# Patient Record
Sex: Male | Born: 1975 | Race: White | Hispanic: No | Marital: Married | Smoking: Never smoker
Health system: Southern US, Community
[De-identification: ages and names within clinical notes are randomized; demographics above are authoritative.]

---

## 1999-12-21 ENCOUNTER — Ambulatory Visit (HOSPITAL_COMMUNITY): Admission: RE | Admit: 1999-12-21 | Discharge: 1999-12-21 | Payer: Self-pay | Admitting: *Deleted

## 1999-12-21 ENCOUNTER — Encounter: Payer: Self-pay | Admitting: *Deleted

## 2010-11-24 ENCOUNTER — Emergency Department (INDEPENDENT_AMBULATORY_CARE_PROVIDER_SITE_OTHER): Payer: BC Managed Care – PPO

## 2010-11-24 ENCOUNTER — Emergency Department (HOSPITAL_BASED_OUTPATIENT_CLINIC_OR_DEPARTMENT_OTHER)
Admission: EM | Admit: 2010-11-24 | Discharge: 2010-11-24 | Disposition: A | Discharge status: Home / Self Care | Payer: BC Managed Care – PPO | Attending: Emergency Medicine | Admitting: Emergency Medicine

## 2010-11-24 DIAGNOSIS — W268XXA Contact with other sharp object(s), not elsewhere classified, initial encounter: Secondary | ICD-10-CM

## 2010-11-24 DIAGNOSIS — S61409A Unspecified open wound of unspecified hand, initial encounter: Secondary | ICD-10-CM

## 2010-11-24 DIAGNOSIS — L02519 Cutaneous abscess of unspecified hand: Secondary | ICD-10-CM | POA: Insufficient documentation

## 2011-10-30 ENCOUNTER — Encounter (INDEPENDENT_AMBULATORY_CARE_PROVIDER_SITE_OTHER): Payer: BC Managed Care – PPO | Admitting: Ophthalmology

## 2011-10-30 DIAGNOSIS — H43819 Vitreous degeneration, unspecified eye: Secondary | ICD-10-CM

## 2011-10-30 DIAGNOSIS — H33009 Unspecified retinal detachment with retinal break, unspecified eye: Secondary | ICD-10-CM

## 2011-10-30 DIAGNOSIS — H33309 Unspecified retinal break, unspecified eye: Secondary | ICD-10-CM

## 2011-11-02 ENCOUNTER — Encounter (INDEPENDENT_AMBULATORY_CARE_PROVIDER_SITE_OTHER): Payer: BC Managed Care – PPO | Admitting: Ophthalmology

## 2011-11-02 DIAGNOSIS — H33309 Unspecified retinal break, unspecified eye: Secondary | ICD-10-CM

## 2011-11-15 ENCOUNTER — Ambulatory Visit (INDEPENDENT_AMBULATORY_CARE_PROVIDER_SITE_OTHER): Payer: BC Managed Care – PPO | Admitting: Ophthalmology

## 2011-11-15 DIAGNOSIS — H33309 Unspecified retinal break, unspecified eye: Secondary | ICD-10-CM

## 2012-03-18 ENCOUNTER — Ambulatory Visit (INDEPENDENT_AMBULATORY_CARE_PROVIDER_SITE_OTHER): Payer: BC Managed Care – PPO | Admitting: Ophthalmology

## 2016-01-11 ENCOUNTER — Observation Stay
Admission: EM | Admit: 2016-01-11 | Discharge: 2016-01-12 | Disposition: A | Discharge status: Home / Self Care | Payer: Managed Care, Other (non HMO) | Attending: Surgery | Admitting: Surgery

## 2016-01-11 ENCOUNTER — Emergency Department: Payer: Managed Care, Other (non HMO)

## 2016-01-11 ENCOUNTER — Encounter: Payer: Self-pay | Admitting: Emergency Medicine

## 2016-01-11 ENCOUNTER — Emergency Department: Payer: Managed Care, Other (non HMO) | Admitting: Anesthesiology

## 2016-01-11 ENCOUNTER — Encounter
Admission: EM | Disposition: A | Discharge status: Home / Self Care | Payer: Self-pay | Source: Home / Self Care | Attending: Emergency Medicine

## 2016-01-11 DIAGNOSIS — K353 Acute appendicitis with localized peritonitis, without perforation or gangrene: Secondary | ICD-10-CM | POA: Insufficient documentation

## 2016-01-11 DIAGNOSIS — K3580 Unspecified acute appendicitis: Secondary | ICD-10-CM | POA: Diagnosis present

## 2016-01-11 DIAGNOSIS — K4090 Unilateral inguinal hernia, without obstruction or gangrene, not specified as recurrent: Secondary | ICD-10-CM | POA: Insufficient documentation

## 2016-01-11 DIAGNOSIS — K8020 Calculus of gallbladder without cholecystitis without obstruction: Secondary | ICD-10-CM | POA: Diagnosis not present

## 2016-01-11 HISTORY — PX: LAPAROSCOPIC APPENDECTOMY: SHX408

## 2016-01-11 LAB — CBC
HEMATOCRIT: 42.1 % (ref 40.0–52.0)
HEMOGLOBIN: 14.4 g/dL (ref 13.0–18.0)
MCH: 29.6 pg (ref 26.0–34.0)
MCHC: 34.2 g/dL (ref 32.0–36.0)
MCV: 86.5 fL (ref 80.0–100.0)
Platelets: 220 10*3/uL (ref 150–440)
RBC: 4.87 MIL/uL (ref 4.40–5.90)
RDW: 13.5 % (ref 11.5–14.5)
WBC: 7.3 10*3/uL (ref 3.8–10.6)

## 2016-01-11 LAB — COMPREHENSIVE METABOLIC PANEL
ALT: 18 U/L (ref 17–63)
AST: 30 U/L (ref 15–41)
Albumin: 4.6 g/dL (ref 3.5–5.0)
Alkaline Phosphatase: 51 U/L (ref 38–126)
Anion gap: 9 (ref 5–15)
BUN: 16 mg/dL (ref 6–20)
CO2: 26 mmol/L (ref 22–32)
Calcium: 9.2 mg/dL (ref 8.9–10.3)
Chloride: 104 mmol/L (ref 101–111)
Creatinine, Ser: 1.28 mg/dL — ABNORMAL HIGH (ref 0.61–1.24)
GFR calc Af Amer: >60 mL/min (ref >60)
GFR calc non Af Amer: >60 mL/min (ref >60)
Glucose, Bld: 106 mg/dL — ABNORMAL HIGH (ref 65–99)
Potassium: 4 mmol/L (ref 3.5–5.1)
Sodium: 139 mmol/L (ref 135–145)
Total Bilirubin: 0.6 mg/dL (ref 0.3–1.2)
Total Protein: 7 g/dL (ref 6.5–8.1)

## 2016-01-11 LAB — URINALYSIS COMPLETE WITH MICROSCOPIC (ARMC ONLY)
BACTERIA UA: NONE SEEN
Bilirubin Urine: NEGATIVE
GLUCOSE, UA: NEGATIVE mg/dL
Ketones, ur: NEGATIVE mg/dL
Leukocytes, UA: NEGATIVE
Nitrite: NEGATIVE
Protein, ur: NEGATIVE mg/dL
SQUAMOUS EPITHELIAL / LPF: NONE SEEN
Specific Gravity, Urine: 1.024 (ref 1.005–1.030)
pH: 5 (ref 5.0–8.0)

## 2016-01-11 LAB — LIPASE, BLOOD: Lipase: 23 U/L (ref 11–51)

## 2016-01-11 SURGERY — APPENDECTOMY, LAPAROSCOPIC
Anesthesia: General

## 2016-01-11 MED ORDER — LIDOCAINE HCL (CARDIAC) 20 MG/ML IV SOLN
INTRAVENOUS | Status: DC | PRN
  Administered 2016-01-11: 20 mg via INTRAVENOUS

## 2016-01-11 MED ORDER — MORPHINE SULFATE (PF) 2 MG/ML IV SOLN: 2.0000 mg | INTRAVENOUS | Status: DC | PRN

## 2016-01-11 MED ORDER — ONDANSETRON HCL 4 MG/2ML IJ SOLN
INTRAMUSCULAR | Status: DC | PRN
  Administered 2016-01-11: 4 mg via INTRAVENOUS

## 2016-01-11 MED ORDER — HYDROCODONE-ACETAMINOPHEN 5-325 MG PO TABS
1.0000 | ORAL_TABLET | ORAL | Status: DC | PRN
  Administered 2016-01-12 (×2): 1 via ORAL
  Filled 2016-01-11 (×2): qty 1

## 2016-01-11 MED ORDER — DIATRIZOATE MEGLUMINE & SODIUM 66-10 % PO SOLN
15.0000 mL | Freq: Once | ORAL | Status: AC
  Administered 2016-01-11: 15 mL via ORAL

## 2016-01-11 MED ORDER — DEXAMETHASONE SODIUM PHOSPHATE 10 MG/ML IJ SOLN
INTRAMUSCULAR | Status: DC | PRN
  Administered 2016-01-11: 4 mg via INTRAVENOUS

## 2016-01-11 MED ORDER — BUPIVACAINE-EPINEPHRINE (PF) 0.25% -1:200000 IJ SOLN
INTRAMUSCULAR | Status: DC | PRN
  Administered 2016-01-11: 30 mL via PERINEURAL

## 2016-01-11 MED ORDER — ONDANSETRON HCL 4 MG/2ML IJ SOLN: 4.0000 mg | Freq: Once | INTRAMUSCULAR | Status: DC | PRN

## 2016-01-11 MED ORDER — IOPAMIDOL (ISOVUE-300) INJECTION 61%
100.0000 mL | Freq: Once | INTRAVENOUS | Status: AC | PRN
  Administered 2016-01-11: 100 mL via INTRAVENOUS
  Filled 2016-01-11: qty 100

## 2016-01-11 MED ORDER — FENTANYL CITRATE (PF) 100 MCG/2ML IJ SOLN: 25.0000 ug | INTRAMUSCULAR | Status: DC | PRN

## 2016-01-11 MED ORDER — SUGAMMADEX SODIUM 500 MG/5ML IV SOLN
INTRAVENOUS | Status: DC | PRN
  Administered 2016-01-11: 390 mg via INTRAVENOUS

## 2016-01-11 MED ORDER — DEXTROSE 5 % IV SOLN
2.0000 g | Freq: Once | INTRAVENOUS | Status: AC
  Administered 2016-01-11: 2 g via INTRAVENOUS
  Filled 2016-01-11: qty 2

## 2016-01-11 MED ORDER — SUCCINYLCHOLINE CHLORIDE 20 MG/ML IJ SOLN
INTRAMUSCULAR | Status: DC | PRN
  Administered 2016-01-11: 120 mg via INTRAVENOUS

## 2016-01-11 MED ORDER — ROCURONIUM BROMIDE 100 MG/10ML IV SOLN
INTRAVENOUS | Status: DC | PRN
  Administered 2016-01-11: 20 mg via INTRAVENOUS

## 2016-01-11 MED ORDER — FENTANYL CITRATE (PF) 100 MCG/2ML IJ SOLN
INTRAMUSCULAR | Status: DC | PRN
  Administered 2016-01-11: 100 ug via INTRAVENOUS
  Administered 2016-01-11: 50 ug via INTRAVENOUS

## 2016-01-11 MED ORDER — LACTATED RINGERS IV SOLN
INTRAVENOUS | Status: DC
Start: 2016-01-11 — End: 2016-01-12
  Administered 2016-01-12: 01:00:00 via INTRAVENOUS

## 2016-01-11 MED ORDER — PROPOFOL 10 MG/ML IV BOLUS
INTRAVENOUS | Status: DC | PRN
  Administered 2016-01-11: 170 mg via INTRAVENOUS

## 2016-01-11 MED ORDER — MIDAZOLAM HCL 2 MG/2ML IJ SOLN
INTRAMUSCULAR | Status: DC | PRN
  Administered 2016-01-11: 2 mg via INTRAVENOUS

## 2016-01-11 MED ORDER — LACTATED RINGERS IV SOLN
INTRAVENOUS | Status: DC | PRN
Start: 2016-01-11 — End: 2016-01-11
  Administered 2016-01-11: 22:00:00 via INTRAVENOUS

## 2016-01-11 MED ORDER — HYDROCODONE-ACETAMINOPHEN 5-325 MG PO TABS: 1.0000 | ORAL_TABLET | ORAL | Status: AC | PRN

## 2016-01-11 MED ORDER — LACTATED RINGERS IV SOLN: INTRAVENOUS | Status: DC

## 2016-01-11 SURGICAL SUPPLY — 49 items
ADH LQ OCL WTPRF AMP STRL LF (MISCELLANEOUS) ×1
ADHESIVE MASTISOL STRL (MISCELLANEOUS) ×3 IMPLANT
APPLIER CLIP ROT 10 11.4 M/L (STAPLE) ×3
APR CLP MED LRG 11.4X10 (STAPLE) ×1
BLADE SURG SZ11 CARB STEEL (BLADE) ×3 IMPLANT
CANISTER SUCT 3000ML (MISCELLANEOUS) ×3 IMPLANT
CATH TRAY 16F METER LATEX (MISCELLANEOUS) ×3 IMPLANT
CHLORAPREP W/TINT 26ML (MISCELLANEOUS) ×3 IMPLANT
CLIP APPLIE ROT 10 11.4 M/L (STAPLE) ×1 IMPLANT
CLOSURE WOUND 1/2 X4 (GAUZE/BANDAGES/DRESSINGS) ×1
CUTTER FLEX LINEAR 45M (STAPLE) ×3 IMPLANT
DEVICE TROCAR PUNCTURE CLOSURE (ENDOMECHANICALS) ×3 IMPLANT
ELECT REM PT RETURN 9FT ADLT (ELECTROSURGICAL) ×3
ELECTRODE REM PT RTRN 9FT ADLT (ELECTROSURGICAL) ×1 IMPLANT
ENDOPOUCH RETRIEVER 10 (MISCELLANEOUS) ×3 IMPLANT
GAUZE SPONGE NON-WVN 2X2 STRL (MISCELLANEOUS) ×3 IMPLANT
GLOVE BIO SURGEON STRL SZ8 (GLOVE) ×9 IMPLANT
GOWN STRL REUS W/ TWL LRG LVL3 (GOWN DISPOSABLE) ×2 IMPLANT
GOWN STRL REUS W/TWL LRG LVL3 (GOWN DISPOSABLE) ×6
HEMOSTAT SURGICEL 2X14 (HEMOSTASIS) ×2 IMPLANT
IRRIGATION STRYKERFLOW (MISCELLANEOUS) ×1 IMPLANT
IRRIGATOR STRYKERFLOW (MISCELLANEOUS) ×3
KIT RM TURNOVER STRD PROC AR (KITS) ×3 IMPLANT
LABEL OR SOLS (LABEL) ×1 IMPLANT
NDL SAFETY 22GX1.5 (NEEDLE) ×3 IMPLANT
NEEDLE VERESS 14GA 120MM (NEEDLE) ×3 IMPLANT
NS IRRIG 500ML POUR BTL (IV SOLUTION) ×3 IMPLANT
PACK LAP CHOLECYSTECTOMY (MISCELLANEOUS) ×3 IMPLANT
RELOAD 45 VASCULAR/THIN (ENDOMECHANICALS) ×3 IMPLANT
RELOAD STAPLE 45 2.5 WHT GRN (ENDOMECHANICALS) ×1 IMPLANT
RELOAD STAPLE 45 3.5 BLU ETS (ENDOMECHANICALS) ×1 IMPLANT
RELOAD STAPLE TA45 3.5 REG BLU (ENDOMECHANICALS) ×3 IMPLANT
SCISSORS METZENBAUM CVD 33 (INSTRUMENTS) ×2 IMPLANT
SLEEVE ENDOPATH XCEL 5M (ENDOMECHANICALS) ×3 IMPLANT
SOL .9 NS 3000ML IRR  AL (IV SOLUTION) ×2
SOL .9 NS 3000ML IRR AL (IV SOLUTION) ×1
SOL .9 NS 3000ML IRR UROMATIC (IV SOLUTION) ×1 IMPLANT
SPONGE LAP 18X18 5 PK (GAUZE/BANDAGES/DRESSINGS) ×3 IMPLANT
SPONGE VERSALON 2X2 STRL (MISCELLANEOUS) ×9
STRIP CLOSURE SKIN 1/2X4 (GAUZE/BANDAGES/DRESSINGS) ×2 IMPLANT
SUT MNCRL 4-0 (SUTURE) ×3
SUT MNCRL 4-0 27XMFL (SUTURE) ×1
SUT VICRYL 0 TIES 12 18 (SUTURE) ×3 IMPLANT
SUTURE MNCRL 4-0 27XMF (SUTURE) ×1 IMPLANT
TRAP SPECIMEN MUCOUS 40CC (MISCELLANEOUS) ×1 IMPLANT
TROCAR XCEL 12X100 BLDLESS (ENDOMECHANICALS) ×3 IMPLANT
TROCAR XCEL NON-BLD 5MMX100MML (ENDOMECHANICALS) ×3 IMPLANT
TUBING INSUFFLATOR HI FLOW (MISCELLANEOUS) ×3 IMPLANT
TUBING SCD EXTENSION 9995 GYN (TUBING) ×2 IMPLANT

## 2016-01-11 NOTE — Anesthesia Procedure Notes (Signed)
Procedure Name: Intubation Date/Time: 01/11/2016 10:17 PM Performed by: Waldo LaineJUSTIS, Hermes Wafer Pre-anesthesia Checklist: Emergency Drugs available, Suction available, Timeout performed and Patient identified Patient Re-evaluated:Patient Re-evaluated prior to inductionOxygen Delivery Method: Circle system utilized Preoxygenation: Pre-oxygenation with 100% oxygen Intubation Type: IV induction, Rapid sequence and Cricoid Pressure applied Laryngoscope Size: Miller and 2 Grade View: Grade I Number of attempts: 1 Airway Equipment and Method: Stylet Placement Confirmation: ETT inserted through vocal cords under direct vision,  positive ETCO2 and breath sounds checked- equal and bilateral Secured at: 22 cm Tube secured with: Tape Dental Injury: Teeth and Oropharynx as per pre-operative assessment

## 2016-01-11 NOTE — Op Note (Signed)
laparascopic appendectomy   Donald Farrell Date of operation:  01/11/2016  Indications: The patient presented with a history of  abdominal pain. Workup has revealed findings consistent with acute appendicitis.  Pre-operative Diagnosis: Acute appendicitis  Post-operative Diagnosis: Acute suppurative appendicitis nonruptured  Surgeon: Adah Salvageichard E. Excell Seltzerooper, MD, FACS  Anesthesia: General with endotracheal tube  Procedure Details  The patient was seen again in the preop area. The options of surgery versus observation were reviewed with the patient and/or family. The risks of bleeding, infection, recurrence of symptoms, negative laparoscopy, potential for an open procedure, bowel injury, abscess or infection, were all reviewed as well. The patient was taken to Operating Room, identified as Donald Farrell and the procedure verified as laparoscopic appendectomy. A Time Out was held and the above information confirmed.  The patient was placed in the supine position and general anesthesia was induced.  Antibiotic prophylaxis was administered and VT E prophylaxis was in place. A Foley catheter was placed by the nursing staff.   The abdomen was prepped and draped in a sterile fashion. An infraumbilical incision was made. A Veress needle was placed and pneumoperitoneum was obtained. A 5 mm trocar port was placed without difficulty and the abdominal cavity was explored.  Under direct vision a 5 mm suprapubic port was placed and a 13 mm left lateral port was placed all under direct vision.  The appendix was identified and found to be acutely inflamed . It was in a partially retrocecal position and the lateral reflection was taken down with sharp dissection only .  The appendix was carefully dissected. The base of the appendix was dissected out and divided with a standard load Endo GIA. The mesoappendix was divided with a vascular load Endo GIA. There was some bleeding from the staple line and this was  reinforced with clips. The appendix was passed out through the left lateral port site with the aid of an Endo Catch bag. The right lower quadrant and pelvis was then irrigated with copious amounts of normal saline which was aspirated. There was still some bleeding from the raw edges of the retroperitoneal surface. This was irrigated and aspirated multiple times and still continued to bleed but no obvious site of surgical bleeding could be identified. A piece of Surgicel was placed in this area and reinspected. Inspection  failed to identify any additional bleeding and there were no signs of bowel injury. Therefore the left lateral port site was closed under direct vision utilizing an Endo Close technique with 0 Vicryl interrupted sutures, all under direct vision.   Again the right lower quadrant was inspected there was no sign of bleeding or bowel injury therefore pneumoperitoneum was released, all ports were removed and the skin incisions were approximated with subcuticular 4-0 Monocryl. Steri-Strips and Mastisol and sterile dressings were placed.  The patient tolerated the procedure well, there were no complications. The sponge lap and needle count were correct at the end of the procedure.  The patient was taken to the recovery room in stable condition to be admitted for continued care.  Findings: Acute appendicitis nonruptured  Estimated Blood Loss: 25 cc                  Specimens: appendix         Complications:  None                  Brionne Mertz E. Excell Seltzerooper MD, FACS

## 2016-01-11 NOTE — ED Provider Notes (Signed)
[facility] Emergency Department Provider Note  ____________________________________________    I have reviewed the triage vital signs and the nursing notes.   HISTORY  Chief Complaint Abdominal Pain    HPI TRIPTON NED is a 40 y.o. male who presents with complaints of abdominal pain. Patient reports generalized abdominal discomfort started yesterday with some mild nausea and decreased appetite. Today his pain localized to the right lower quadrant. He has never had this before. He denies injury to the area. No fevers or chills. Normal stools. No history of abdominal surgery   History reviewed. No pertinent past medical history.  There are no active problems to display for this patient.   History reviewed. No pertinent past surgical history.  No current outpatient prescriptions on file.  Allergies Review of patient's allergies indicates no known allergies.  History reviewed. No pertinent family history.  Social History Social History  Substance Use Topics  . Smoking status: Never Smoker   . Smokeless tobacco: None  . Alcohol Use: Yes    Review of Systems  Constitutional: Negative for fever. Eyes: Negative for redness ENT: Negative for sore throat Cardiovascular: Negative for chest pain Respiratory: Negative for Cough Gastrointestinal: As above Genitourinary: Negative for dysuria. No hematuria Musculoskeletal: Negative for back pain. Skin: Negative for rash. Neurological: Negative for headache Psychiatric: no anxiety    ____________________________________________   PHYSICAL EXAM:  VITAL SIGNS: ED Triage Vitals  Enc Vitals Group     BP 01/11/16 1756 155/90 mmHg     Pulse Rate 01/11/16 1756 69     Resp 01/11/16 1756 20     Temp 01/11/16 1756 97.6 F (36.4 C)     Temp Source 01/11/16 1756 Oral     SpO2 01/11/16 1756 100 %     Weight 01/11/16 1756 215 lb (97.523 kg)     Height 01/11/16 1756 6' (1.829 m)     Head Cir --       Peak Flow --      Pain Score 01/11/16 1757 2     Pain Loc --      Pain Edu? --      Excl. in GC? --      Constitutional: Alert and oriented. Well appearing and in no distress.  Eyes: Conjunctivae are normal. No erythema or injection ENT   Head: Normocephalic and atraumatic.   Mouth/Throat: Mucous membranes are moist. Cardiovascular: Normal rate, regular rhythm. Normal and symmetric distal pulses are present in the upper extremities.  Respiratory: Normal respiratory effort without tachypnea nor retractions.  Gastrointestinal: Tenderness palpation over McBurney's point. No distention. There is no CVA tenderness. Genitourinary: deferred Musculoskeletal: Nontender with normal range of motion in all extremities.  Neurologic:  Normal speech and language. No gross focal neurologic deficits are appreciated. Skin:  Skin is warm, dry and intact. No rash noted. Psychiatric: Mood and affect are normal. Patient exhibits appropriate insight and judgment.  ____________________________________________    LABS (pertinent positives/negatives)  Labs Reviewed  COMPREHENSIVE METABOLIC PANEL - Abnormal; Notable for the following:    Glucose, Bld 106 (*)    Creatinine, Ser 1.28 (*)    All other components within normal limits  URINALYSIS COMPLETEWITH MICROSCOPIC ([facility] ONLY) - Abnormal; Notable for the following:    Color, Urine YELLOW (*)    APPearance CLEAR (*)    Hgb urine dipstick 1+ (*)    All other components within normal limits  LIPASE, BLOOD  CBC    ____________________________________________   EKG  None  ____________________________________________    RADIOLOGY  CT consistent with acute appendicitis  ____________________________________________   PROCEDURES  Procedure(s) performed: none  Critical Care performed: none  ____________________________________________   INITIAL IMPRESSION / ASSESSMENT AND PLAN / ED COURSE  Pertinent labs & imaging results  that were available during my care of the patient were reviewed by me and considered in my medical decision making (see chart for details).  Patient presents with generalized abdominal pain now localized to the right lower quadrant, this is suspicious for appendicitis. We will obtain CT abdomen and pelvis and reevaluate  CT scan is consistent with acute appendicitis, discussed with Dr. Excell Seltzerooper of surgery who will see the patient  ____________________________________________   FINAL CLINICAL IMPRESSION(S) / ED DIAGNOSES  Final diagnoses:  Acute appendicitis with localized peritonitis          Donald Everyobert Trentin Knappenberger, MD 01/11/16 2051

## 2016-01-11 NOTE — Transfer of Care (Signed)
Immediate Anesthesia Transfer of Care Note  Patient: Donald Farrell  Procedure(s) Performed: Procedure(s): APPENDECTOMY LAPAROSCOPIC (N/A)  Patient Location: PACU  Anesthesia Type:General  Level of Consciousness: awake  Airway & Oxygen Therapy: Patient Spontanous Breathing and Patient connected to face mask oxygen  Post-op Assessment: Report given to RN and Post -op Vital signs reviewed and stable  Post vital signs: Reviewed and stable  Last Vitals:  Filed Vitals:   01/11/16 1756 01/11/16 2107  BP: 155/90 137/102  Pulse: 69 64  Temp: 36.4 C 36.9 C  Resp: 20 16    Last Pain:  Filed Vitals:   01/11/16 2112  PainSc: 0-No pain         Complications: No apparent anesthesia complications

## 2016-01-11 NOTE — H&P (Signed)
Donald Farrell is an 40 y.o. male.    Chief Complaint: Right lower quadrant pain  HPI: This a patient with 2 days of right lower quadrant pain it actually started in the epigastrium or periumbilical area and migrated to the right side it is slightly worsened but gradually. He's had some nausea and no emesis no fevers or chills never had an episode like this before workup in the emergency room suggest acute appendicitis on CT scan. Patient had 2 ablations for irregular heart beat back in 2010 and 2011 no symptoms since and no medications History reviewed. No pertinent past medical history.  History reviewed. No pertinent past surgical history.  History reviewed. No pertinent family history. Social History:  reports that he has never smoked. He does not have any smokeless tobacco history on file. He reports that he drinks alcohol. He reports that he does not use illicit drugs.  Allergies: No Known Allergies   (Not in a hospital admission)   Review of Systems  Constitutional: Negative for fever and chills.  HENT: Negative.   Eyes: Negative.   Respiratory: Negative.   Cardiovascular: Negative.   Gastrointestinal: Positive for nausea and abdominal pain. Negative for heartburn, vomiting, diarrhea and constipation.  Genitourinary: Negative for dysuria, urgency and frequency.  Musculoskeletal: Negative.   Skin: Negative.   Neurological: Negative.   Endo/Heme/Allergies: Negative.   Psychiatric/Behavioral: Negative.      Physical Exam:  BP 137/102 mmHg  Pulse 64  Temp(Src) 98.4 F (36.9 C) (Oral)  Resp 16  Ht 6' (1.829 m)  Wt 215 lb (97.523 kg)  BMI 29.15 kg/m2  SpO2 100%  Physical Exam  Constitutional: He is oriented to person, place, and time and well-developed, well-nourished, and in no distress. No distress.  HENT:  Head: Normocephalic and atraumatic.  Eyes: Pupils are equal, round, and reactive to light. Right eye exhibits no discharge. Left eye exhibits no  discharge. No scleral icterus.  Neck: Normal range of motion.  Cardiovascular: Normal rate, regular rhythm and normal heart sounds.   Pulmonary/Chest: Effort normal and breath sounds normal. No respiratory distress. He has no wheezes. He has no rales.  Abdominal: Soft. He exhibits no distension. There is tenderness. There is no rebound and no guarding.  Mild percussion tenderness maximal tenderness at McBurney's point CVA tenderness  Musculoskeletal: Normal range of motion. He exhibits no edema.  Lymphadenopathy:    He has no cervical adenopathy.  Neurological: He is alert and oriented to person, place, and time.  Skin: Skin is warm and dry. He is not diaphoretic. No erythema.  Psychiatric: Mood and affect normal.  Vitals reviewed.       Results for orders placed or performed during the hospital encounter of 01/11/16 (from the past 48 hour(s))  Lipase, blood     Status: None   Collection Time: 01/11/16  6:00 PM  Result Value Ref Range   Lipase 23 11 - 51 U/L  Comprehensive metabolic panel     Status: Abnormal   Collection Time: 01/11/16  6:00 PM  Result Value Ref Range   Sodium 139 135 - 145 mmol/L   Potassium 4.0 3.5 - 5.1 mmol/L   Chloride 104 101 - 111 mmol/L   CO2 26 22 - 32 mmol/L   Glucose, Bld 106 (H) 65 - 99 mg/dL   BUN 16 6 - 20 mg/dL   Creatinine, Ser 1.28 (H) 0.61 - 1.24 mg/dL   Calcium 9.2 8.9 - 10.3 mg/dL   Total Protein 7.0 6.5 -  8.1 g/dL   Albumin 4.6 3.5 - 5.0 g/dL   AST 30 15 - 41 U/L   ALT 18 17 - 63 U/L   Alkaline Phosphatase 51 38 - 126 U/L   Total Bilirubin 0.6 0.3 - 1.2 mg/dL   GFR calc non Af Amer >60 >60 mL/min   GFR calc Af Amer >60 >60 mL/min    Comment: (NOTE) The eGFR has been calculated using the CKD EPI equation. This calculation has not been validated in all clinical situations. eGFR's persistently <60 mL/min signify possible Chronic Kidney Disease.    Anion gap 9 5 - 15  CBC     Status: None   Collection Time: 01/11/16  6:00 PM   Result Value Ref Range   WBC 7.3 3.8 - 10.6 K/uL   RBC 4.87 4.40 - 5.90 MIL/uL   Hemoglobin 14.4 13.0 - 18.0 g/dL   HCT 42.1 40.0 - 52.0 %   MCV 86.5 80.0 - 100.0 fL   MCH 29.6 26.0 - 34.0 pg   MCHC 34.2 32.0 - 36.0 g/dL   RDW 13.5 11.5 - 14.5 %   Platelets 220 150 - 440 K/uL  Urinalysis complete, with microscopic     Status: Abnormal   Collection Time: 01/11/16  6:00 PM  Result Value Ref Range   Color, Urine YELLOW (A) YELLOW   APPearance CLEAR (A) CLEAR   Glucose, UA NEGATIVE NEGATIVE mg/dL   Bilirubin Urine NEGATIVE NEGATIVE   Ketones, ur NEGATIVE NEGATIVE mg/dL   Specific Gravity, Urine 1.024 1.005 - 1.030   Hgb urine dipstick 1+ (A) NEGATIVE   pH 5.0 5.0 - 8.0   Protein, ur NEGATIVE NEGATIVE mg/dL   Nitrite NEGATIVE NEGATIVE   Leukocytes, UA NEGATIVE NEGATIVE   RBC / HPF 0-5 0 - 5 RBC/hpf   WBC, UA 0-5 0 - 5 WBC/hpf   Bacteria, UA NONE SEEN NONE SEEN   Squamous Epithelial / LPF NONE SEEN NONE SEEN   Ct Abdomen Pelvis W Contrast  01/11/2016  CLINICAL DATA:  40 year old male with right lower quadrant abdominal pain. EXAM: CT ABDOMEN AND PELVIS WITH CONTRAST TECHNIQUE: Multidetector CT imaging of the abdomen and pelvis was performed using the standard protocol following bolus administration of intravenous contrast. CONTRAST:  177m ISOVUE-300 IOPAMIDOL (ISOVUE-300) INJECTION 61% COMPARISON:  None. FINDINGS: The visualized lung bases are clear. No intra-abdominal free air or free fluid. There multiple stones within the gallbladder. No pericholecystic fluid or evidence of active inflammation. There is apparent minimal fatty infiltration of the liver. A subcentimeter hypodense focus in the right lobe of the liver inferiorly adjacent to the gallbladder fossa is (series 2, image 26) is not characterized but may represent a hemangioma. The pancreas, spleen, adrenal glands, kidneys, visualized ureters, and urinary bladder appear unremarkable. 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 The prostate and seminal vesicles are grossly unremarkable There is no evidence of bowel obstruction. Go the appendix is enlarged and inflamed. The appendix is located in the right lower quadrant posterior and inferior to the cecum. No drainable fluid collection/ abscess identified. There is no evidence of appendiceal rupture or perforation. The abdominal aorta and IVC appears unremarkable. No portal venous gas identified. There is no adenopathy. Small fat containing left inguinal hernia. The abdominal wall soft tissues are otherwise unremarkable. The osseous structures are intact. IMPRESSION: Acute appendicitis.  No abscess. Cholelithiasis. Electronically Signed   By: AAnner CreteM.D.   On: 01/11/2016 20:25  Assessment/Plan  Likely acute appendicitis by history physical exam and CT findings his white blood cell count is normal. I recommend a laparoscopy I did discuss with him antibiotic therapy and the risk of failure. We discussed risks of bleeding infection recurrence negative laparoscopy inaccuracies of CAT scans. He has family understood and agreed to proceed with the plan for laparoscopic appendectomy tonight  Florene Glen, MD, FACS

## 2016-01-11 NOTE — ED Notes (Signed)
Pt to ed with c/o abd pain that started about 2 days ago with nausea.  Pt denies vomiting and denies diarrhea.

## 2016-01-11 NOTE — Anesthesia Preprocedure Evaluation (Addendum)
Anesthesia Evaluation  Patient identified by MRN, date of birth, ID band Patient awake    Reviewed: Allergy & Precautions, NPO status , Patient's Chart, lab work & pertinent test results, reviewed documented beta blocker date and time   Airway Mallampati: II  TM Distance: >3 FB     Dental  (+) Chipped   Pulmonary           Cardiovascular + dysrhythmias Ventricular Tachycardia      Neuro/Psych    GI/Hepatic   Endo/Other    Renal/GU      Musculoskeletal   Abdominal   Peds  Hematology   Anesthesia Other Findings Hx of severe tachycardia and multiple PVCs. Has had 2 cardiac ablations, last one in 2012. No tachy since, only occasional PVCs.  Reproductive/Obstetrics                            Anesthesia Physical Anesthesia Plan  ASA: II  Anesthesia Plan: General   Post-op Pain Management:    Induction: Intravenous and Rapid sequence  Airway Management Planned: Oral ETT  Additional Equipment:   Intra-op Plan:   Post-operative Plan:   Informed Consent: I have reviewed the patients History and Physical, chart, labs and discussed the procedure including the risks, benefits and alternatives for the proposed anesthesia with the patient or authorized representative who has indicated his/her understanding and acceptance.     Plan Discussed with: CRNA  Anesthesia Plan Comments:         Anesthesia Quick Evaluation

## 2016-01-11 NOTE — Discharge Instructions (Signed)
Remove dressing in 24 hours. °May shower in 24 hours. °Leave paper strips in place. °Resume all home medications. °Follow-up with Dr. Jatziry Wechter in 10 days. °

## 2016-01-12 ENCOUNTER — Encounter: Payer: Self-pay | Admitting: Surgery

## 2016-01-12 NOTE — Discharge Summary (Signed)
  Patient ID: Donald Farrell MRN: [id number] DOB/AGE: [birth year] 40 y.o.  Admit date: 01/11/2016 Discharge date: 01/12/2016   Discharge Diagnoses:  Active Problems:   Acute appendicitis   Procedures:lap appy by Dr. Fausto Skillernooper  Hospital Course: 40 yo admitted with RLQ pain and w/u revealed appendicitis. Had an uneventful appendectomy and was kept overnight. At the time of DC he was ambulating , tolerating PO, AVSS. Abdomen was soft , incisions c/d/i w/o infections. Condition at time of DC is stable   Disposition: 01-Home or Self Care  Discharge Instructions    Call MD for:  difficulty breathing, headache or visual disturbances    Complete by:  As directed      Call MD for:  hives    Complete by:  As directed      Call MD for:  persistant dizziness or light-headedness    Complete by:  As directed      Call MD for:  persistant nausea and vomiting    Complete by:  As directed      Call MD for:  redness, tenderness, or signs of infection (pain, swelling, redness, odor or green/yellow discharge around incision site)    Complete by:  As directed      Call MD for:  severe uncontrolled pain    Complete by:  As directed      Call MD for:  temperature >100.4    Complete by:  As directed      Diet - low sodium heart healthy    Complete by:  As directed      Increase activity slowly    Complete by:  As directed      Lifting restrictions    Complete by:  As directed   20 lbs     Remove dressing in 24 hours    Complete by:  As directed             Medication List    TAKE these medications        HYDROcodone-acetaminophen 5-325 MG tablet  Commonly known as:  NORCO/VICODIN  Take 1-2 tablets by mouth every 4 (four) hours as needed for moderate pain.           Follow-up Information    Follow up with Dionne Miloichard Cooper, MD In 10 days.   Specialty:  Surgery   Why:  For wound re-check   Contact information:   [address] [place] [id number] [phone]         Sterling Bigiego Lossie Kalp, MD FACS

## 2016-01-12 NOTE — Anesthesia Postprocedure Evaluation (Signed)
Anesthesia Post Note  Patient: Donald Farrell  Procedure(s) Performed: Procedure(s) (LRB): APPENDECTOMY LAPAROSCOPIC (N/A)  Patient location during evaluation: Other Anesthesia Type: General Level of consciousness: awake and alert Pain management: pain level controlled Vital Signs Assessment: post-procedure vital signs reviewed and stable Respiratory status: spontaneous breathing, nonlabored ventilation, respiratory function stable and patient connected to nasal cannula oxygen Cardiovascular status: blood pressure returned to baseline and stable Postop Assessment: no signs of nausea or vomiting Anesthetic complications: no    Last Vitals:  Filed Vitals:   01/12/16 0126 01/12/16 0511  BP: 129/84 124/70  Pulse: 63 64  Temp: 36.7 C 36.6 C  Resp: 18 18    Last Pain:  Filed Vitals:   01/12/16 0527  PainSc: 3                  Daquawn Seelman S

## 2016-01-12 NOTE — Progress Notes (Signed)
Pt d/c to home today. IV removed intact.  Rx's given to pt w/all questions and concerns addressed.  D/C paperwork reviewed and education provided with all questions and concerns addressed.  Pt wife at bedside for home transport.  Volunteer services contact for transportation from room to exit.    

## 2016-01-13 LAB — SURGICAL PATHOLOGY

## 2016-01-20 ENCOUNTER — Ambulatory Visit: Payer: Managed Care, Other (non HMO) | Admitting: Surgery

## 2017-02-18 IMAGING — CT CT ABD-PELV W/ CM
2 of 4 series · 15 of 46 positions shown, 17 images · IV contrast (iopamidol)
Comparison: None.

CLINICAL DATA: 40-year-old male with right lower quadrant abdominal
pain.

EXAM:
CT ABDOMEN AND PELVIS WITH CONTRAST
TECHNIQUE: Multidetector CT imaging of the abdomen and pelvis was performed
using the standard protocol following bolus administration of
intravenous contrast.
CONTRAST:  100mL [id number]-PUU IOPAMIDOL (2D94KB-PUU) INJECTION 61%

[Series 2: routine abd pel with · axial · 0.82mm/px · z∈[+132,+627]mm · 12 of 109 slices shown, 14 images]
[im 5/109  soft-tissue]
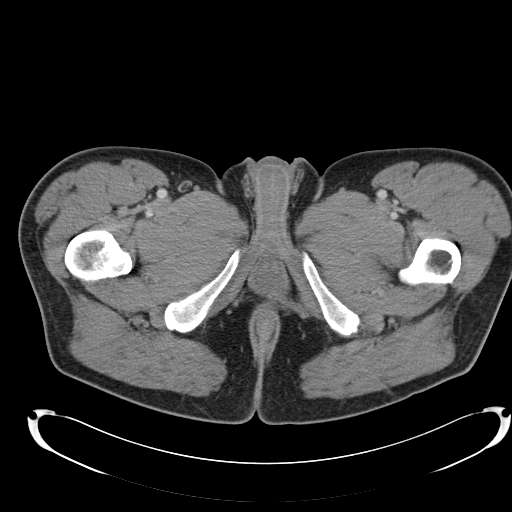
[im 5/109  bone]
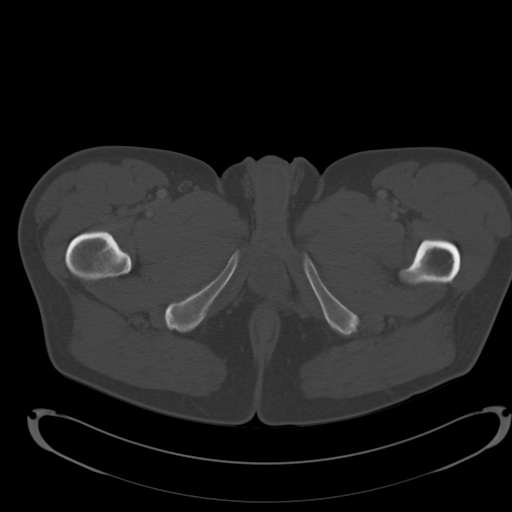
[im 14/109  soft-tissue]
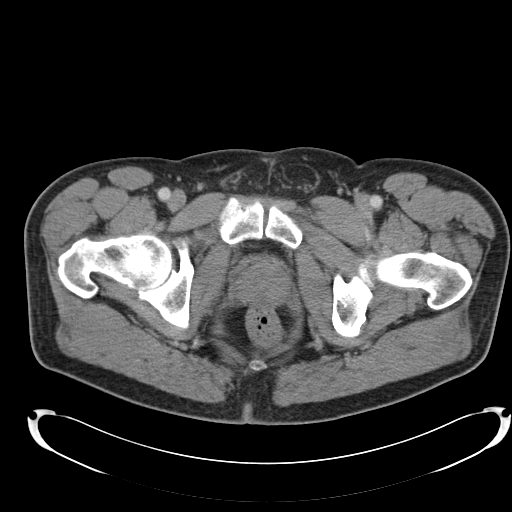
[im 23/109  soft-tissue]
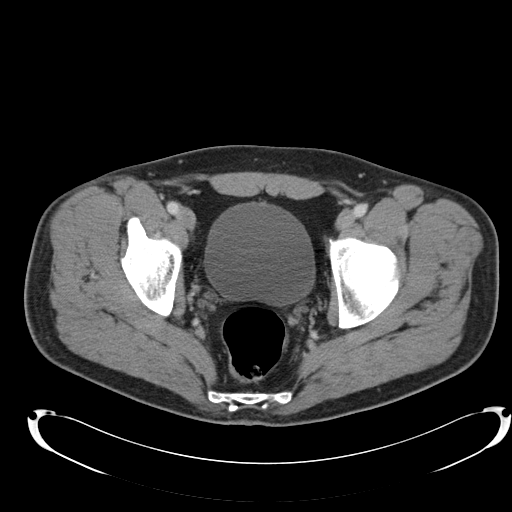
[im 32/109  soft-tissue]
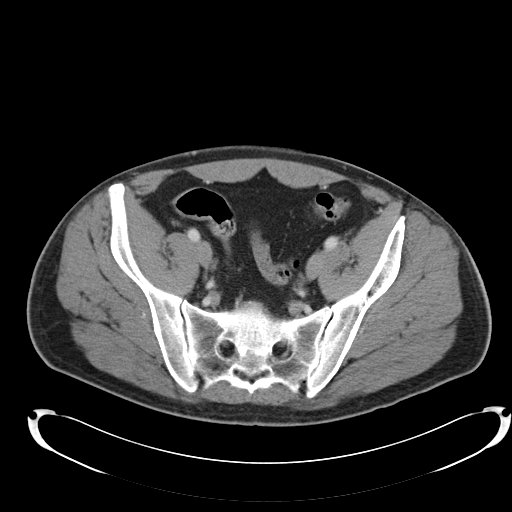
[im 41/109  soft-tissue]
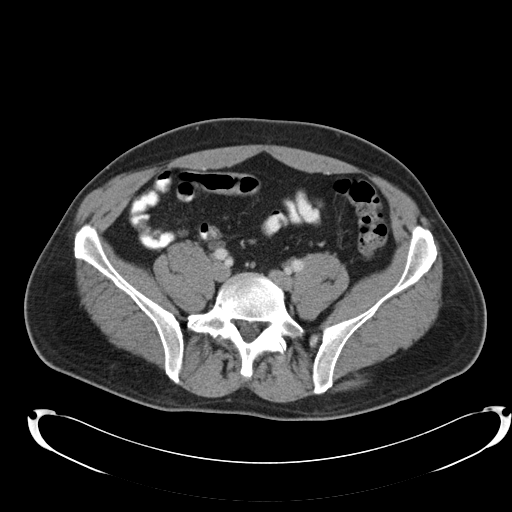
[im 50/109  soft-tissue]
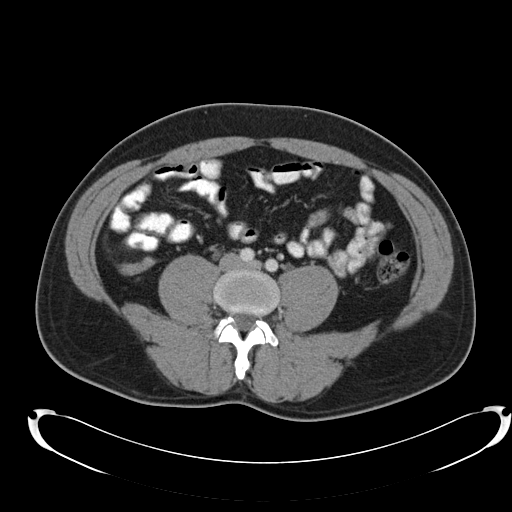
[im 59/109  soft-tissue]
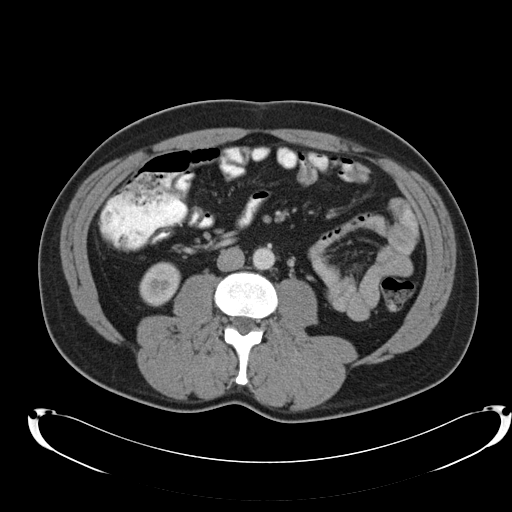
[im 68/109  soft-tissue]
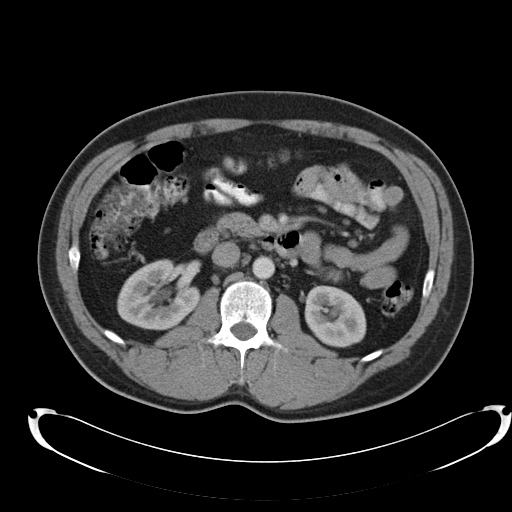
[im 77/109  soft-tissue]
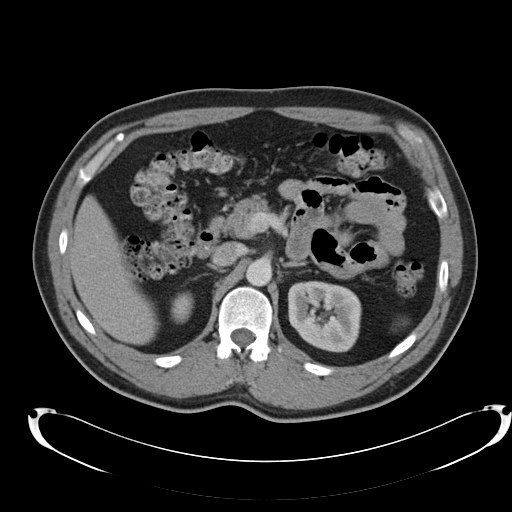
[im 77/109  bone]
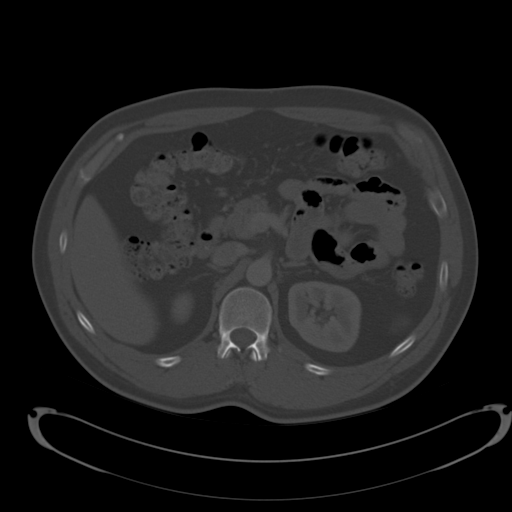
[im 86/109  soft-tissue]
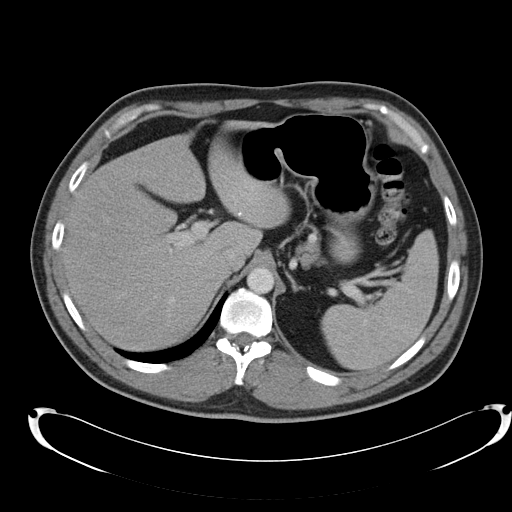
[im 95/109  soft-tissue]
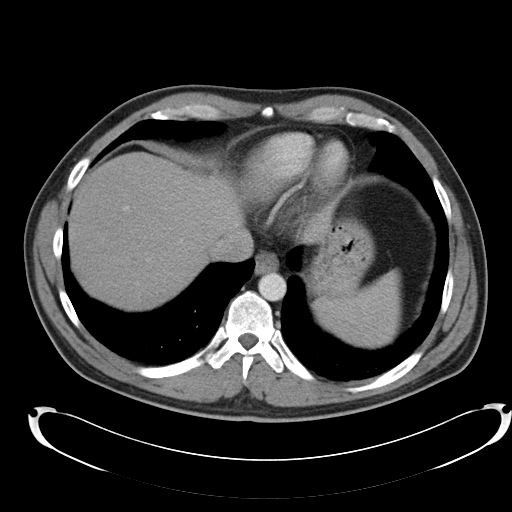
[im 104/109  soft-tissue]
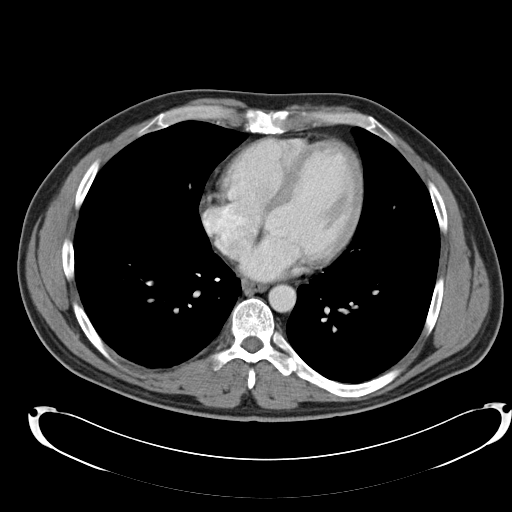

[Series 5: cor routine abd pel with · coronal · 0.72mm/px · 3 of 135 slices shown]
[im 45/135  soft-tissue]
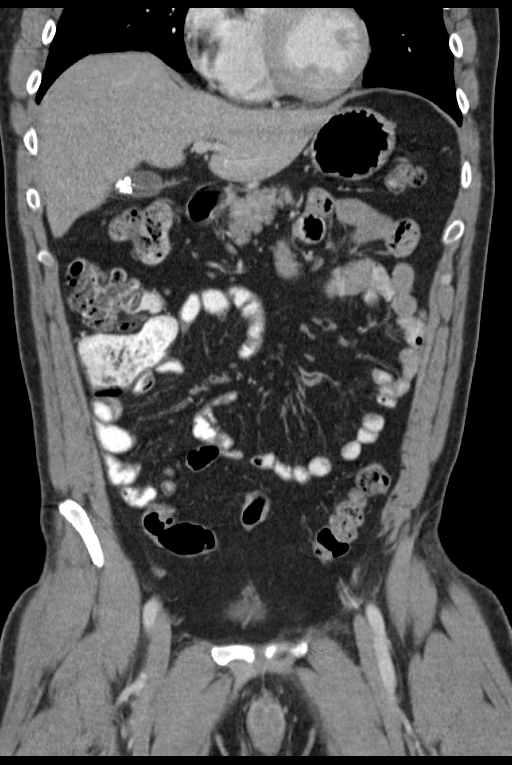
[im 60/135  soft-tissue]
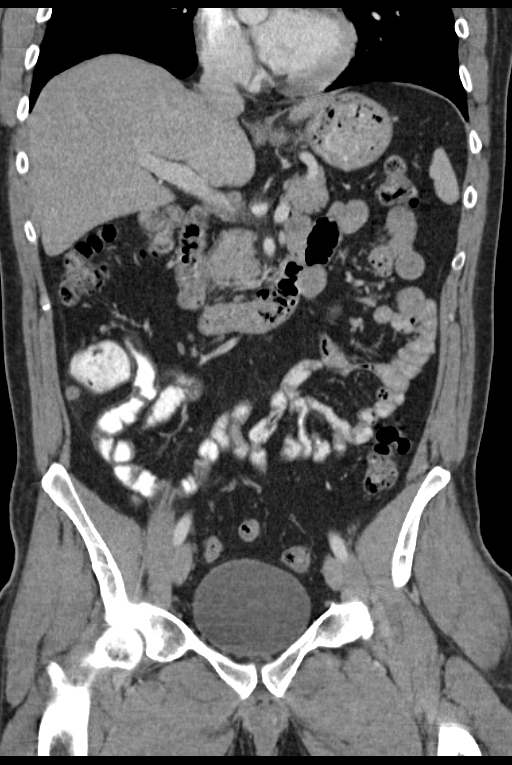
[im 75/135  soft-tissue]
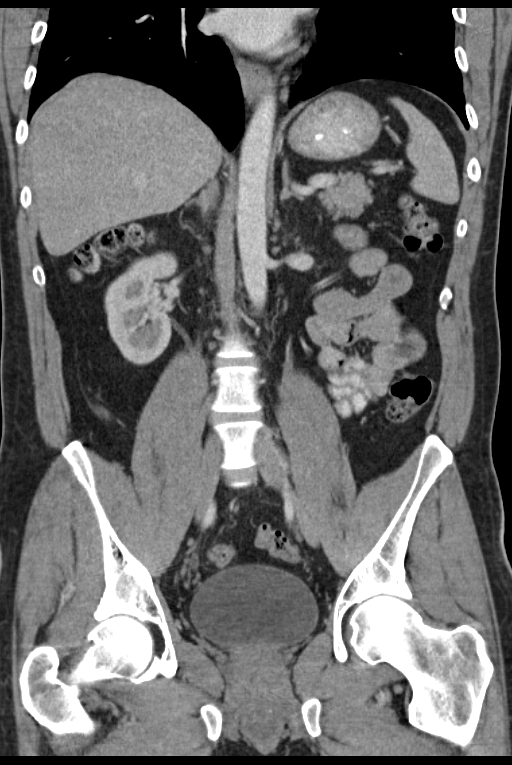

[15 of 46 positions shown; findings below may reference images not displayed]

FINDINGS: The visualized lung bases are clear. No intra-abdominal free air or
free fluid.

There multiple stones within the gallbladder. No pericholecystic
fluid or evidence of active inflammation. There is apparent minimal
fatty infiltration of the liver. A subcentimeter hypodense focus in
the right lobe of the liver inferiorly adjacent to the gallbladder
fossa is (series 2, image 26) is not characterized but may represent
a hemangioma. The pancreas, spleen, adrenal glands, kidneys,
visualized ureters, and urinary bladder appear unremarkable. There
is partially duplicated appearance of the midportion of the right
ureter. The prostate and seminal vesicles are grossly unremarkable

There is no evidence of bowel obstruction. Go the appendix is
enlarged and inflamed. The appendix is located in the right lower
quadrant posterior and inferior to the cecum. No drainable fluid
collection/ abscess identified. There is no evidence of appendiceal
rupture or perforation.

The abdominal aorta and IVC appears unremarkable. No portal venous
gas identified. There is no adenopathy. Small fat containing left
inguinal hernia. The abdominal wall soft tissues are otherwise
unremarkable. The osseous structures are intact.
IMPRESSION: Acute appendicitis.  No abscess.

Cholelithiasis.
